# Patient Record
Sex: Female | Born: 1995 | Race: White | Hispanic: No | Marital: Single | Smoking: Never smoker
Health system: Southern US, Community
[De-identification: ages and names within clinical notes are randomized; demographics above are authoritative.]

## PROBLEM LIST (undated history)

## (undated) DIAGNOSIS — G8929 Other chronic pain: Secondary | ICD-10-CM

## (undated) DIAGNOSIS — S060X9A Concussion with loss of consciousness of unspecified duration, initial encounter: Secondary | ICD-10-CM

## (undated) DIAGNOSIS — R569 Unspecified convulsions: Secondary | ICD-10-CM

## (undated) DIAGNOSIS — B2790 Infectious mononucleosis, unspecified without complication: Secondary | ICD-10-CM

## (undated) DIAGNOSIS — M549 Dorsalgia, unspecified: Secondary | ICD-10-CM

## (undated) DIAGNOSIS — S060XAA Concussion with loss of consciousness status unknown, initial encounter: Secondary | ICD-10-CM

## (undated) HISTORY — DX: Concussion with loss of consciousness of unspecified duration, initial encounter: S06.0X9A

## (undated) HISTORY — DX: Concussion with loss of consciousness status unknown, initial encounter: S06.0XAA

## (undated) HISTORY — DX: Dorsalgia, unspecified: M54.9

## (undated) HISTORY — DX: Infectious mononucleosis, unspecified without complication: B27.90

## (undated) HISTORY — DX: Other chronic pain: G89.29

## (undated) HISTORY — PX: TOE SURGERY: SHX1073

---
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
## 2014-03-31 ENCOUNTER — Encounter: Payer: Self-pay | Admitting: Family Medicine

## 2014-03-31 ENCOUNTER — Ambulatory Visit (INDEPENDENT_AMBULATORY_CARE_PROVIDER_SITE_OTHER): Payer: BC Managed Care – PPO | Admitting: Family Medicine

## 2014-03-31 VITALS — BP 106/51 | HR 59 | Wt 136.0 lb

## 2014-03-31 DIAGNOSIS — F900 Attention-deficit hyperactivity disorder, predominantly inattentive type: Secondary | ICD-10-CM | POA: Diagnosis not present

## 2014-03-31 DIAGNOSIS — Z23 Encounter for immunization: Secondary | ICD-10-CM | POA: Diagnosis not present

## 2014-03-31 MED ORDER — AMPHETAMINE-DEXTROAMPHETAMINE 10 MG PO TABS: ORAL_TABLET | ORAL | Status: DC

## 2014-03-31 NOTE — Progress Notes (Signed)
CC: Rudean CurtHaley Goodwin is a 18 y.o. female is here for f/u adderall   Subjective: HPI:  Followup ADHD: Continues to take Adderall one to 2 tablets a day of the 10 mg formulation. Symptoms of easy distractibility and difficulty with concentrating are absent provided she takes this on the schedule listed above. Symptoms returned to a moderate degree of she does not take the medication. Symptoms are present both at home and at school when not taking medication. She denies any known side effects. Specifically denies sleep disturbance, appetite suppression, unintentional weight loss, anxiety, paranoia, irregular heartbeat.  Denies any mental disturbance.  She brings up that she had one months worth [place] in [place] back in September by a different provider.   Review Of Systems Outlined In HPI  Past Medical History  Diagnosis Date  . Chronic back pain   . Seizures   . Mononucleosis   . Concussion     Past Surgical History  Procedure Laterality Date  . Toe surgery     Family History  Problem Relation Age of Onset  . Heart attack Father 157  . Cancer Maternal Grandfather     lung  . Heart disease Maternal Grandfather   . Heart attack Paternal Grandmother   . Heart attack Paternal Grandfather     History   Social History  . Marital Status: Single    Spouse Name: N/A    Number of Children: N/A  . Years of Education: N/A   Occupational History  . Not on file.   Social History Main Topics  . Smoking status: Never Smoker   . Smokeless tobacco: Never Used  . Alcohol Use: No  . Drug Use: No  . Sexual Activity:    Other Topics Concern  . Not on file   Social History Narrative  . No narrative on file     Objective: BP 106/51  Pulse 59  Wt 136 lb (61.689 kg)  Vital signs reviewed. General: Alert and Oriented, No Acute Distress HEENT: Pupils equal, round, reactive to light. Conjunctivae clear.  External ears unremarkable.  Moist mucous membranes. Lungs: Clear and comfortable  work of breathing, speaking in full sentences without accessory muscle use. Cardiac: Regular rate and rhythm.  Neuro: CN II-XII grossly intact, gait normal. Extremities: No peripheral edema.  Strong peripheral pulses.  Mental Status: No depression, anxiety, nor agitation. Logical though process. Skin: Warm and dry.  Assessment & Plan: Kristen Goodwin was seen today for f/u adderall.  Diagnoses and associated orders for this visit:  ADD (attention deficit hyperactivity disorder, inattentive type) - Discontinue: amphetamine-dextroamphetamine (ADDERALL) 10 MG tablet; One to two by mouth daily as needed for concentration. - Discontinue: amphetamine-dextroamphetamine (ADDERALL) 10 MG tablet; One to two by mouth daily as needed for concentration. - amphetamine-dextroamphetamine (ADDERALL) 10 MG tablet; One to two by mouth daily as needed for concentration.    ADD/ADHD: Controlled continue current Adderall regimen above. Discussed that she should only have one provider at a time imaging this medication. I thanked her for bringing up the situation back in September.  She plans on having my office manage this medication from now on.  No suspicion of abuse/misbehaving.   Return in about 3 months (around 07/01/2014) for ADHD Follow Up.

## 2014-08-20 ENCOUNTER — Telehealth: Payer: Self-pay | Admitting: Family Medicine

## 2014-08-20 ENCOUNTER — Ambulatory Visit (INDEPENDENT_AMBULATORY_CARE_PROVIDER_SITE_OTHER): Payer: BLUE CROSS/BLUE SHIELD | Admitting: Family Medicine

## 2014-08-20 ENCOUNTER — Encounter: Payer: Self-pay | Admitting: Family Medicine

## 2014-08-20 VITALS — BP 104/62 | HR 62 | Wt 139.0 lb

## 2014-08-20 DIAGNOSIS — J3081 Allergic rhinitis due to animal (cat) (dog) hair and dander: Secondary | ICD-10-CM

## 2014-08-20 DIAGNOSIS — F900 Attention-deficit hyperactivity disorder, predominantly inattentive type: Secondary | ICD-10-CM | POA: Diagnosis not present

## 2014-08-20 DIAGNOSIS — R569 Unspecified convulsions: Secondary | ICD-10-CM

## 2014-08-20 DIAGNOSIS — N926 Irregular menstruation, unspecified: Secondary | ICD-10-CM

## 2014-08-20 DIAGNOSIS — R55 Syncope and collapse: Secondary | ICD-10-CM

## 2014-08-20 DIAGNOSIS — J309 Allergic rhinitis, unspecified: Secondary | ICD-10-CM | POA: Insufficient documentation

## 2014-08-20 MED ORDER — AMPHETAMINE-DEXTROAMPHETAMINE 10 MG PO TABS: ORAL_TABLET | ORAL | Status: DC

## 2014-08-20 MED ORDER — BECLOMETHASONE DIPROPIONATE 80 MCG/ACT NA AERS: INHALATION_SPRAY | NASAL | Status: AC

## 2014-08-20 MED ORDER — NORGESTIM-ETH ESTRAD TRIPHASIC 0.18/0.215/0.25 MG-25 MCG PO TABS: 1.0000 | ORAL_TABLET | Freq: Every day | ORAL | Status: DC

## 2014-08-20 NOTE — Progress Notes (Signed)
CC: Kristen Goodwin is a 19 y.o. female is here for f/u medications   Subjective: HPI:  Follow-upirregular menses: Since taking oral contraceptives her periods have been predictable with less flow less menstrual cramping and no side effects. She would like refills.  Follow-up ADD: Since I saw her last she has only been taking Adderall 10 mg on average once a day. She feels like she does not need to take an afternoon dose most days of the week. She's been using this to help with easy distractibility and difficulty concentrating on one task at a time predominately only in the academic setting. She denies any known side effects. Sleep disturbance, appetite suppression or anxiety  Follow-up seizures: She wants know she can be referred to a neurologist in the [place] area since she spends most of her time there for school and [place] She has not had any seizure activity that she knows of over the last year. She has episodes of lightheadedness that have even lead to fainting however these only have occurred when she is donating blood, receiving an injection in her toe, or when going to the bathroom in the morning, this latter situation only caused lightheadedness.  Her neurologist was worried that these symptoms were suggestive of seizure-like activity and has been increasing her Lamictal. She believes that she's had an EEG in the last 1-2 years which was normal.  Complains of nasal congestion that is present on a daily basis. Symptoms usually mild and tolerable however since her roommate got a cat symptoms are moderate in severity and also accompanied by facial pressure in the left cheek. Symptoms come and go a daily basis but have been present for the past 3 months. No interventions as of yet   Review Of Systems Outlined In HPI  Past Medical History  Diagnosis Date  . Chronic back pain   . Seizures   . Mononucleosis   . Concussion     Past Surgical  History  Procedure Laterality Date  . Toe surgery     Family History  Problem Relation Age of Onset  . Heart attack Father 557  . Cancer Maternal Grandfather     lung  . Heart disease Maternal Grandfather   . Heart attack Paternal Grandmother   . Heart attack Paternal Grandfather     History   Social History  . Marital Status: Single    Spouse Name: N/A  . Number of Children: N/A  . Years of Education: N/A   Occupational History  . Not on file.   Social History Main Topics  . Smoking status: Never Smoker   . Smokeless tobacco: Never Used  . Alcohol Use: No  . Drug Use: No  . Sexual Activity: Not on file   Other Topics Concern  . Not on file   Social History Narrative     Objective: BP 104/62 mmHg  Pulse 62  Wt 139 lb (63.05 kg)  General: Alert and Oriented, No Acute Distress HEENT: Pupils equal, round, reactive to light. Conjunctivae clear.  Moist because membranes pharynx unremarkable Lungs: Clear to auscultation bilaterally, no wheezing/ronchi/rales.  Comfortable work of breathing. Good air movement. Cardiac: Regular rate and rhythm. Normal S1/S2.  No murmurs, rubs, nor gallops.   Extremities: No peripheral edema.  Strong peripheral pulses.  Mental Status: No depression, anxiety, nor agitation. Skin: Warm and dry.  Assessment & Plan: Kristen Goodwin was seen today for f/u medications.  Diagnoses and all  orders for this visit:  Irregular periods Orders: -     Norgestimate-Ethinyl Estradiol Triphasic 0.18/0.215/0.25 MG-25 MCG tab; Take 1 tablet by mouth daily. (Start first pack on 1st Sunday on/after period)  ADD (attention deficit hyperactivity disorder, inattentive type) Orders: -     Discontinue: amphetamine-dextroamphetamine (ADDERALL) 10 MG tablet; One to two by mouth daily as needed for concentration. -     Discontinue: amphetamine-dextroamphetamine (ADDERALL) 10 MG tablet; One to two by mouth daily as needed for concentration. -      amphetamine-dextroamphetamine (ADDERALL) 10 MG tablet; One to two by mouth daily as needed for concentration.  Seizures  Vasovagal syncope  Allergic rhinitis due to animal hair and dander  Other orders -     Beclomethasone Dipropionate (QNASL) 80 MCG/ACT AERS; One puff each nostril daily.   Irregular periods: Controlled on [facility] refills provided ADD: Controlled on 10 mg's of Adderall 1-2 times a day, very low suspicion for misuse, discussed that this medication could potentially lower her seizure threshold, she is willing to take this risk and plans on discussing this with her new neurologist. Seizures: Clinically controlled on Lamictal. Her symptoms in the history of present illness are more suggestive of vasovagal syncope, I will reach out to Dr. Christy Gentles or Dr. Velvet Bathe who practicing [place] to see who is most appropriate for this referral she can transfer care Allergic rhinitis: Provided with sample of Qnasl, if beneficial also have to do is call for a formal prescription   Return in about 3 months (around 11/20/2014).

## 2014-08-20 NOTE — Telephone Encounter (Signed)
Sue Lushndrea, Will you please let patient know that my neurologist friend in [place] recommended that Dr. Naaman PlummerBill Boles would be more appropriate for managing seizures since he has more expertise in this field.  I've known Dr. Tyron RussellBoles as a friend for four years now and I think she'll be in good hands.  I'll place a referral for this today.

## 2014-08-20 NOTE — Telephone Encounter (Signed)
Left message on pt's vm [phone]. # given to me by her  mother

## 2015-05-05 ENCOUNTER — Ambulatory Visit (INDEPENDENT_AMBULATORY_CARE_PROVIDER_SITE_OTHER): Payer: BLUE CROSS/BLUE SHIELD | Admitting: Family Medicine

## 2015-05-05 ENCOUNTER — Encounter: Payer: Self-pay | Admitting: Family Medicine

## 2015-05-05 VITALS — BP 111/72 | HR 56 | Wt 135.0 lb

## 2015-05-05 DIAGNOSIS — Z23 Encounter for immunization: Secondary | ICD-10-CM

## 2015-05-05 DIAGNOSIS — F900 Attention-deficit hyperactivity disorder, predominantly inattentive type: Secondary | ICD-10-CM

## 2015-05-05 DIAGNOSIS — R569 Unspecified convulsions: Secondary | ICD-10-CM | POA: Diagnosis not present

## 2015-05-05 MED ORDER — METHYLPHENIDATE HCL ER 18 MG PO TB24
18.0000 mg | ORAL_TABLET | Freq: Every day | ORAL | Status: DC
Start: 2015-05-05 — End: 2015-05-13

## 2015-05-05 NOTE — Progress Notes (Signed)
CC: Kristen Goodwin is a 19 y.o. female is here for Medication Management   Subjective: HPI:  Follow-up ADD: Taking Adderall on a daily basis 1-2 doses a day. She notes that's helping drastically with attention and helping with her grades. Unfortunately soon after she takes a dose she feels nauseous and sometimes she will even vomit in our 2 after taking the medication. Symptoms have been worsening occurring a couple days a week. Nothing else seems to make symptoms better or worse. She denies anxiety, depression, and normal disturbance. No unintentional weight loss or sleep disturbance. She's been on the extended release formulation however caused depression.   She has established with Dr. Tyron RussellBoles in [place] and has not had any seizure activity since I saw her last. No medication changes   Review Of Systems Outlined In HPI  Past Medical History  Diagnosis Date  . Chronic back pain   . Seizures (HCC)   . Mononucleosis   . Concussion     Past Surgical History  Procedure Laterality Date  . Toe surgery     Family History  Problem Relation Age of Onset  . Heart attack Father [id number]  . Cancer Maternal Grandfather     lung  . Heart disease Maternal Grandfather   . Heart attack Paternal Grandmother   . Heart attack Paternal Grandfather     Social History   Social History  . Marital Status: Single    Spouse Name: N/A  . Number of Children: N/A  . Years of Education: N/A   Occupational History  . Not on file.   Social History Main Topics  . Smoking status: Never Smoker   . Smokeless tobacco: Never Used  . Alcohol Use: No  . Drug Use: No  . Sexual Activity: Not on file   Other Topics Concern  . Not on file   Social History Narrative     Objective: BP 111/72 mmHg  Pulse 56  Wt 135 lb (61.236 kg)  Vital signs reviewed. General: Alert and Oriented, No Acute Distress HEENT: Pupils equal, round, reactive to light. Conjunctivae clear.  External ears unremarkable.  Moist mucous  membranes. Lungs: Clear and comfortable work of breathing, speaking in full sentences without accessory muscle use. Cardiac: Regular rate and rhythm.  Neuro: CN II-XII grossly intact, gait normal. Extremities: No peripheral edema.  Strong peripheral pulses.  Mental Status: No depression, anxiety, nor agitation. Logical though process. Skin: Warm and dry. Assessment & Plan: Kristen Goodwin was seen today for medication management.  Diagnoses and all orders for this visit:  ADD (attention deficit hyperactivity disorder, inattentive type) -     methylphenidate 18 MG PO CR tablet; Take 1 tablet (18 mg total) by mouth daily.  Seizures (HCC)   ADD: Controlled but intolerable side effects, switching medication to Concerta. Seizures: Controlled with neurology in [place].  Return in about 3 months (around 08/05/2015) for ADD.

## 2015-05-13 ENCOUNTER — Other Ambulatory Visit: Payer: Self-pay

## 2015-05-13 DIAGNOSIS — F900 Attention-deficit hyperactivity disorder, predominantly inattentive type: Secondary | ICD-10-CM

## 2015-05-13 MED ORDER — METHYLPHENIDATE HCL ER 18 MG PO TB24: 18.0000 mg | ORAL_TABLET | Freq: Every day | ORAL | Status: DC

## 2015-06-03 ENCOUNTER — Encounter: Payer: Self-pay | Admitting: Family Medicine

## 2015-06-03 ENCOUNTER — Ambulatory Visit (INDEPENDENT_AMBULATORY_CARE_PROVIDER_SITE_OTHER): Payer: BLUE CROSS/BLUE SHIELD | Admitting: Family Medicine

## 2015-06-03 VITALS — BP 128/67 | HR 83 | Wt 131.0 lb

## 2015-06-03 DIAGNOSIS — M7662 Achilles tendinitis, left leg: Secondary | ICD-10-CM

## 2015-06-03 DIAGNOSIS — F329 Major depressive disorder, single episode, unspecified: Secondary | ICD-10-CM

## 2015-06-03 DIAGNOSIS — M7661 Achilles tendinitis, right leg: Secondary | ICD-10-CM

## 2015-06-03 DIAGNOSIS — F32A Depression, unspecified: Secondary | ICD-10-CM

## 2015-06-03 MED ORDER — ESCITALOPRAM OXALATE 10 MG PO TABS: 10.0000 mg | ORAL_TABLET | Freq: Every day | ORAL | Status: DC

## 2015-06-03 NOTE — Progress Notes (Signed)
CC: Kristen Goodwin is a 19 y.o. female is here for Depression   Subjective: HPI:  Bilateral heel pain localized on the back of the heel has been present for a few months. Its mild in severity and lashes pressing on it or if she bumps the back of her heel against something. Bad enough to where it limits her exercise routine. No interventions as of yet other than stretching which is not helping. Nothing else seems to make better or worse other than that above. She denies joint pain elsewhere  She complains that 2 years ago she had to quit playing soccer due to pain. Ever since then she's noticed poor motivation to exercise, poor self image and poor self-esteem. She's lost interest in hobbies that used to be pleasurable. Symptoms have been mild in severity but over the past few months moderate in severity and present on a daily basis. Nothing seems to make symptoms better or worse she denies thoughts of one arm herself or others. She had more difficulty concentrating this past semester and wonders if a different dose of  Methylphenidate would help.   Review Of Systems Outlined In HPI  Past Medical History  Diagnosis Date  . Chronic back pain   . Seizures (HCC)   . Mononucleosis   . Concussion     Past Surgical History  Procedure Laterality Date  . Toe surgery     Family History  Problem Relation Age of Onset  . Heart attack Father [id number]  . Cancer Maternal Grandfather     lung  . Heart disease Maternal Grandfather   . Heart attack Paternal Grandmother   . Heart attack Paternal Grandfather     Social History   Social History  . Marital Status: Single    Spouse Name: N/A  . Number of Children: N/A  . Years of Education: N/A   Occupational History  . Not on file.   Social History Main Topics  . Smoking status: Never Smoker   . Smokeless tobacco: Never Used  . Alcohol Use: No  . Drug Use: No  . Sexual Activity: Not on file   Other Topics Concern  . Not on file   Social History  Narrative     Objective: BP 128/67 mmHg  Pulse 83  Wt 131 lb (59.421 kg)  Vital signs reviewed. General: Alert and Oriented, No Acute Distress HEENT: Pupils equal, round, reactive to light. Conjunctivae clear.  External ears unremarkable.  Moist mucous membranes. Lungs: Clear and comfortable work of breathing, speaking in full sentences without accessory muscle use. Cardiac: Regular rate and rhythm.  Neuro: CN II-XII grossly intact, gait normal. Extremities: No peripheral edema.  Strong peripheral pulses.  Mild to moderate bilateral bony hypertrophy at the insertion of the Achilles tendon on the talus Mental Status: No depression, anxiety, nor agitation. Logical though process. Skin: Warm and dry.  Assessment & Plan: Kristen Goodwin was seen today for depression.  Diagnoses and all orders for this visit:  Depression -     escitalopram (LEXAPRO) 10 MG tablet; Take 1 tablet (10 mg total) by mouth daily.  Achilles tendinitis of both lower extremities   Chronic uncontrolled depression over the past 2 years which used to be mild but now moderate in severity. Start Lexapro and call me in 2 weeks if not helping. Achilles tendinitis: she was given a home health rehabilitation plan to engage in for the next 2-4 weeks until pain is absent.  Return in about 3 months (around 09/01/2015).

## 2015-08-28 ENCOUNTER — Other Ambulatory Visit: Payer: Self-pay | Admitting: Family Medicine

## 2015-09-07 ENCOUNTER — Ambulatory Visit: Payer: BLUE CROSS/BLUE SHIELD | Admitting: Family Medicine

## 2015-09-07 ENCOUNTER — Encounter: Payer: Self-pay | Admitting: Family Medicine

## 2015-09-07 ENCOUNTER — Ambulatory Visit (INDEPENDENT_AMBULATORY_CARE_PROVIDER_SITE_OTHER): Payer: BLUE CROSS/BLUE SHIELD | Admitting: Family Medicine

## 2015-09-07 VITALS — BP 126/80 | HR 96 | Wt 142.0 lb

## 2015-09-07 DIAGNOSIS — F900 Attention-deficit hyperactivity disorder, predominantly inattentive type: Secondary | ICD-10-CM

## 2015-09-07 DIAGNOSIS — F32A Depression, unspecified: Secondary | ICD-10-CM | POA: Insufficient documentation

## 2015-09-07 DIAGNOSIS — F329 Major depressive disorder, single episode, unspecified: Secondary | ICD-10-CM

## 2015-09-07 DIAGNOSIS — J3081 Allergic rhinitis due to animal (cat) (dog) hair and dander: Secondary | ICD-10-CM | POA: Diagnosis not present

## 2015-09-07 MED ORDER — ESCITALOPRAM OXALATE 10 MG PO TABS: 10.0000 mg | ORAL_TABLET | Freq: Every day | ORAL | Status: AC

## 2015-09-07 MED ORDER — LISDEXAMFETAMINE DIMESYLATE 40 MG PO CAPS: 40.0000 mg | ORAL_CAPSULE | ORAL | Status: AC

## 2015-09-07 NOTE — Progress Notes (Signed)
CC: Kristen Goodwin is a 20 y.o. female is here for Medication Management   Subjective: HPI:  Follow depression: She is very pleased with Lexapro, she tells me his medication made a huge impact on her outlook on life and she is no longer feeling depressed nor does she have poor self-esteem. She denies any known side effects. She denies any anxiety or depression.  Follow ADD: She had a really bad semester last semester especially in the latter part of the semester. She wants nothing can communicate to her school that medications are being adjusted for ADD and depression during this time which, located her academics. She tells me that Concerta does not seem to help with ADD whatsoever. Even though she takes on a daily basis she still gets distracted and has difficulty with prioritizing her tasks. Symptoms are present to a moderate degree on a daily basis despite taking the medication.  Follow-up allergic rhinitis: Despite taking Zyrtec on a daily basis she's been experiencing nasal congestion and facial pressure for 4 weeks now. Worse when pollen counts are high. Denies any coughing or shortness of breath. Denies sore throat. No fevers or chills nor rash   Review Of Systems Outlined In HPI  Past Medical History  Diagnosis Date  . Chronic back pain   . Seizures (HCC)   . Mononucleosis   . Concussion     Past Surgical History  Procedure Laterality Date  . Toe surgery     Family History  Problem Relation Age of Onset  . Heart attack Father 31  . Cancer Maternal Grandfather     lung  . Heart disease Maternal Grandfather   . Heart attack Paternal Grandmother   . Heart attack Paternal Grandfather     Social History   Social History  . Marital Status: Single    Spouse Name: N/A  . Number of Children: N/A  . Years of Education: N/A   Occupational History  . Not on file.   Social History Main Topics  . Smoking status: Never Smoker   . Smokeless tobacco: Never Used  . Alcohol Use: No   . Drug Use: No  . Sexual Activity: Not on file   Other Topics Concern  . Not on file   Social History Narrative     Objective: BP 126/80 mmHg  Pulse 96  Wt 142 lb (64.411 kg)  Vital signs reviewed. General: Alert and Oriented, No Acute Distress HEENT: Pupils equal, round, reactive to light. Conjunctivae clear.  External ears unremarkable.  Moist mucous membranes. Lungs: Clear and comfortable work of breathing, speaking in full sentences without accessory muscle use. Cardiac: Regular rate and rhythm.  Neuro: CN II-XII grossly intact, gait normal. Extremities: No peripheral edema.  Strong peripheral pulses.  Mental Status: No depression, anxiety, nor agitation. Logical though process. Skin: Warm and dry.  Assessment & Plan: Kristen Goodwin was seen today for medication management.  Diagnoses and all orders for this visit:  Depression -     escitalopram (LEXAPRO) 10 MG tablet; Take 1 tablet (10 mg total) by mouth daily.  ADD (attention deficit hyperactivity disorder, inattentive type)  Allergic rhinitis due to animal hair and dander  Other orders -     lisdexamfetamine (VYVANSE) 40 MG capsule; Take 1 capsule (40 mg total) by mouth every morning.   Depression: Controlled and stable with Lexapro continue current 10 mg regimen and she started considering stopping this sometime in the summer once school is over ADD: Uncontrolled chronic condition stopping Concerta and  switching to Vyvanse, discussed that I be happy to fill out form for her school stating that medication adjustments could've compromised her academics Allergic rhinitis: Uncontrolled chronic condition start over-the-counter Nasacort   Return in about 3 months (around 12/08/2015) for Mood.

## 2015-09-08 ENCOUNTER — Other Ambulatory Visit: Payer: Self-pay

## 2015-09-08 MED ORDER — NORGESTIM-ETH ESTRAD TRIPHASIC 0.18/0.215/0.25 MG-25 MCG PO TABS: ORAL_TABLET | ORAL | Status: AC

## 2015-09-09 ENCOUNTER — Telehealth: Payer: Self-pay | Admitting: *Deleted

## 2015-09-09 ENCOUNTER — Telehealth: Payer: Self-pay | Admitting: Family Medicine

## 2015-09-09 NOTE — Telephone Encounter (Signed)
Will you please let patient know that the withdrawal medical document she asked me to fill out will not help her situation since she never experienced an emergency that would have required her to miss school.

## 2015-09-09 NOTE — Telephone Encounter (Signed)
A PA was initiated and approved for vyvanse. Approved from 09/09/2015 through 09/08/2016   Left message on vm

## 2015-09-22 NOTE — Telephone Encounter (Signed)
Pt vm is full

## 2015-10-11 ENCOUNTER — Telehealth: Payer: Self-pay

## 2015-10-11 NOTE — Telephone Encounter (Signed)
[name] called stated that she talked with the dean of her school and they would like for you fill out those that you were going to fill out back in march for her miss school.  She said that it needed to be detailed why she's taking lexapro and vyvanse.  She also stated that every time she takes vyvanse she gets a migraine.

## 2015-10-21 NOTE — Telephone Encounter (Signed)
Will you please let patient know that I've been trying to find the web site for instructions on how to fill out the documents for her missed time at school.  I'm having trouble finding the right web page.  I'm I wrong to be looking at [facility] [place]'s site? If not what office on that campus would be handing this matter?

## 2015-10-27 NOTE — Telephone Encounter (Signed)
Gave pt my e-mail for the link to web site

## 2019-06-08 ENCOUNTER — Emergency Department (INDEPENDENT_AMBULATORY_CARE_PROVIDER_SITE_OTHER)
Admission: EM | Admit: 2019-06-08 | Discharge: 2019-06-08 | Disposition: A | Discharge status: Home / Self Care | Payer: BC Managed Care – PPO | Source: Home / Self Care

## 2019-06-08 ENCOUNTER — Encounter: Payer: Self-pay | Admitting: Emergency Medicine

## 2019-06-08 ENCOUNTER — Other Ambulatory Visit: Payer: Self-pay

## 2019-06-08 DIAGNOSIS — J320 Chronic maxillary sinusitis: Secondary | ICD-10-CM | POA: Diagnosis not present

## 2019-06-08 MED ORDER — AMOXICILLIN 875 MG PO TABS: 875.0000 mg | ORAL_TABLET | Freq: Two times a day (BID) | ORAL | 0 refills | Status: DC

## 2019-06-08 NOTE — ED Triage Notes (Signed)
Patient c/o sore sinuses x 1 day, history of sinus infections, low grade fever, no cough.

## 2019-06-08 NOTE — ED Provider Notes (Signed)
[facility]    CSN: [id number] Arrival date & time: 06/08/19  1532      History   Chief Complaint Chief Complaint  Patient presents with  . Facial Pain    HPI Kristen Goodwin is a 23 y.o. female.   This is an initial [place] urgent care visit for this 23 year old woman.  Patient c/o sore sinuses x 1 day, history of sinus infections, low grade fever, no cough.  Patient had Covid back in September it was very mild and she had very few symptoms.  She has a history of allergies to animals and [place], who have dogs.  Patient's had some blood-streaked nasal discharge.  She has no cough or loss of sense of smell.     Past Medical History:  Diagnosis Date  . Chronic back pain   . Concussion   . Mononucleosis   . Seizures [facility])     Patient Active Problem List   Diagnosis Date Noted  . Depression 09/07/2015  . Vasovagal syncope 08/20/2014  . Allergic rhinitis 08/20/2014  . 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 Glaucoma screening (5/13: [facility] concerns refered to Dr. Edilia Bo) 04/24/2012  . Irregular periods 04/05/2012  . Chronic back pain   . Seizures ([place])     Past Surgical History:  Procedure Laterality Date  . TOE SURGERY      OB History   No obstetric history on file.      Home Medications    Prior to Admission medications   Medication Sig Start Date End Date Taking? Authorizing Provider  minocycline (MINOCIN) 100 MG capsule Take 100 mg by mouth daily. 05/27/19  Yes [provider]  amoxicillin (AMOXIL) 875 MG tablet Take 1 tablet (875 mg total) by mouth 2 (two) times daily. 06/08/19   Robyn Haber, MD  Beclomethasone Dipropionate (QNASL) 80 MCG/ACT AERS One puff each nostril daily. 08/20/14   Hommel, Sean, DO  escitalopram (LEXAPRO) 10 MG tablet Take 1 tablet (10 mg total) by mouth daily. 09/07/15   Marcial Pacas, DO  lamoTRIgine (LAMICTAL)  200 MG tablet Take 200 mg by mouth daily.    [provider]  lisdexamfetamine (VYVANSE) 40 MG capsule Take 1 capsule (40 mg total) by mouth every morning. 09/07/15   Hommel, Sean, DO  Norgestimate-Ethinyl Estradiol Triphasic (TRINESSA LO) 0.18/0.215/0.25 MG-25 MCG tab TAKE 1 TABLET BY MOUTH DAILY(START FIRST PACK ON 1ST SUNDAY ON/AFTER PERIOD) 09/08/15   Marcial Pacas, DO    Family History Family History  Problem Relation Age of Onset  . Heart attack Father 36  . Cancer Maternal Grandfather        lung  . Heart disease Maternal Grandfather   . Heart attack Paternal Grandmother   . Heart attack Paternal Grandfather     Social History Social History   Tobacco Use  . Smoking status: Never Smoker  . Smokeless tobacco: Never Used  Substance Use Topics  . Alcohol use: No  . Drug use: No     Allergies   Adderall xr [amphetamine-dextroamphet er], Adderall [amphetamine-dextroamphetamine], and Lidocaine   Review of Systems Review of Systems   Physical Exam Triage Vital Signs ED Triage Vitals  Enc Vitals Group     BP      Pulse      Resp      Temp      Temp src      SpO2  Weight      Height      Head Circumference      Peak Flow      Pain Score      Pain Loc      Pain Edu?      Excl. in GC?    No data found.  Updated Vital Signs BP 128/79 (BP Location: Left Arm)   Pulse 88   Temp 99 F (37.2 C) (Oral)   Ht 5\' 6"  (1.676 m)   Wt 70.3 kg   SpO2 98%   BMI 25.02 kg/m    Physical Exam   UC Treatments / Results  Labs (all labs ordered are listed, but only abnormal results are displayed) Labs Reviewed - No data to display  EKG   Radiology No results found.  Procedures Procedures (including critical care time)  Medications Ordered in UC Medications - No data to display  Initial Impression / Assessment and Plan / UC Course  I have reviewed the triage vital signs and the nursing notes.  Pertinent labs & imaging results that were  available during my care of the patient were reviewed by me and considered in my medical decision making (see chart for details).    Final Clinical Impressions(s) / UC Diagnoses   Final diagnoses:  Chronic maxillary sinusitis     Discharge Instructions     Vitamin D3 5000 IU (125 mg) daily Vitamin C 500 mg twice daily Zinc 50 to 75 mg daily  Listerine type mouthwash 4 times a day      ED Prescriptions    Medication Sig Dispense Auth. Provider   amoxicillin (AMOXIL) 875 MG tablet Take 1 tablet (875 mg total) by mouth 2 (two) times daily. 20 tablet , MD     I have reviewed the PDMP during this encounter.   Elvina Sidle, MD 06/08/19 1558

## 2019-06-08 NOTE — Discharge Instructions (Addendum)
Vitamin D3 5000 IU (125 mg) daily °Vitamin C 500 mg twice daily °Zinc 50 to 75 mg daily ° °Listerine type mouthwash 4 times a day ° °

## 2019-06-09 ENCOUNTER — Telehealth: Payer: Self-pay

## 2019-06-09 MED ORDER — AMOXICILLIN 875 MG PO TABS: 875.0000 mg | ORAL_TABLET | Freq: Two times a day (BID) | ORAL | 0 refills | Status: AC

## 2019-06-09 NOTE — Telephone Encounter (Signed)
Resent med to correct pharmacy

## 2019-06-11 ENCOUNTER — Telehealth: Payer: Self-pay | Admitting: Emergency Medicine

## 2019-06-11 LAB — NOVEL CORONAVIRUS, NAA: SARS-CoV-2, NAA: DETECTED — AB

## 2019-06-11 NOTE — Telephone Encounter (Signed)
